# Patient Record
Sex: Male | Born: 1960 | Race: White | Hispanic: Yes | Marital: Married | Smoking: Never smoker
Health system: Southern US, Community
[De-identification: ages and names within clinical notes are randomized; demographics above are authoritative.]

---

## 2003-12-04 ENCOUNTER — Encounter: Admission: RE | Admit: 2003-12-04 | Discharge: 2003-12-04 | Payer: Self-pay | Admitting: Family Medicine

## 2004-11-08 ENCOUNTER — Emergency Department (HOSPITAL_COMMUNITY): Admission: EM | Admit: 2004-11-08 | Discharge: 2004-11-08 | Payer: Self-pay | Admitting: Family Medicine

## 2004-11-08 ENCOUNTER — Ambulatory Visit (HOSPITAL_COMMUNITY): Admission: RE | Admit: 2004-11-08 | Discharge: 2004-11-08 | Payer: Self-pay | Admitting: Family Medicine

## 2004-11-20 ENCOUNTER — Emergency Department (HOSPITAL_COMMUNITY): Admission: EM | Admit: 2004-11-20 | Discharge: 2004-11-20 | Payer: Self-pay | Admitting: Family Medicine

## 2004-11-21 ENCOUNTER — Ambulatory Visit: Payer: Self-pay | Admitting: Sports Medicine

## 2004-11-29 ENCOUNTER — Ambulatory Visit: Payer: Self-pay | Admitting: Family Medicine

## 2005-02-03 ENCOUNTER — Ambulatory Visit: Payer: Self-pay | Admitting: Family Medicine

## 2006-01-29 IMAGING — CT CT HEAD W/O CM
1 of 2 series · 13 of 30 positions shown, 17 images · IV contrast (agent unspecified)
Comparison: None.

CLINICAL DATA: Frontal headache.
 CT HEAD WITHOUT CONTRAST:
TECHNIQUE: Routine unenhanced CT of the brain.

[Series 2: brain · axial · 0.47mm/px · z∈[+193,+321]mm · 13 of 28 slices shown, 17 images]
[im 2/28  brain]
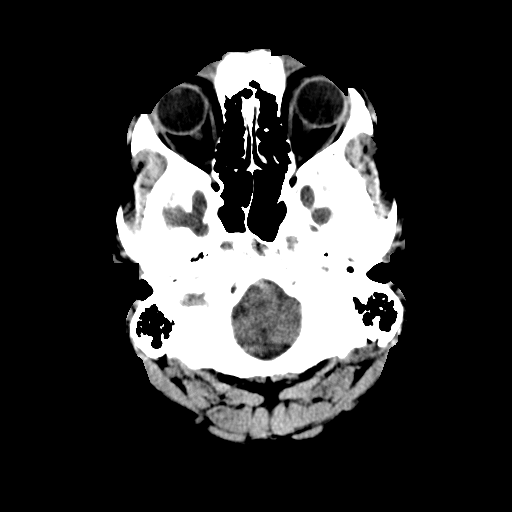
[im 2/28  bone]
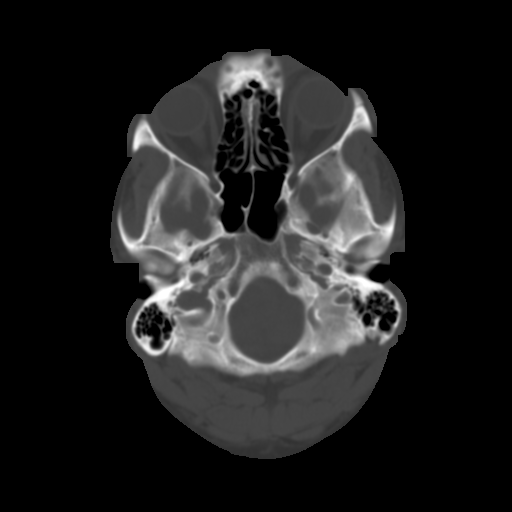
[im 4/28  brain]
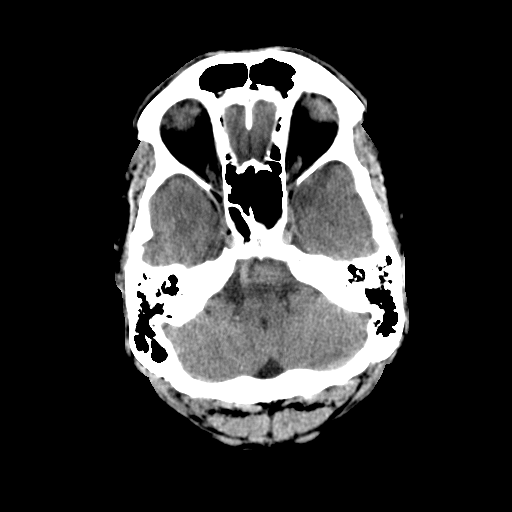
[im 6/28  brain]
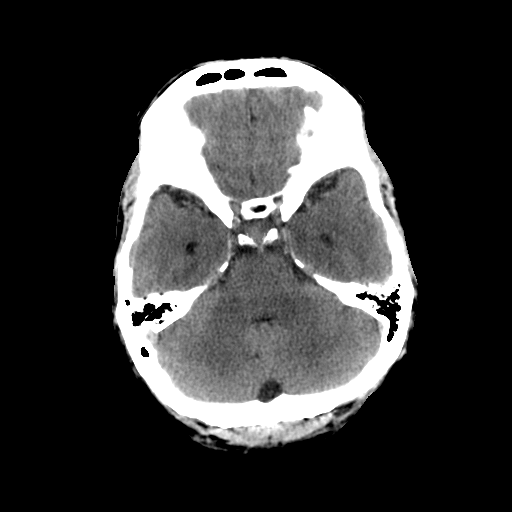
[im 8/28  brain]
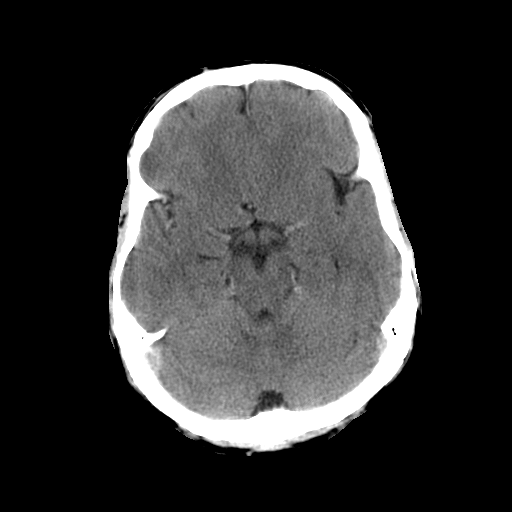
[im 10/28  brain]
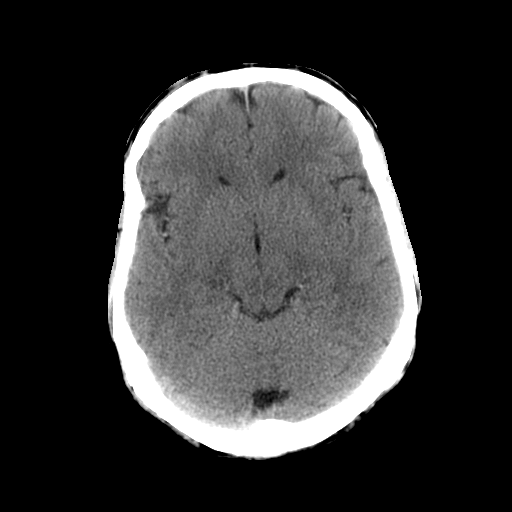
[im 10/28  bone]
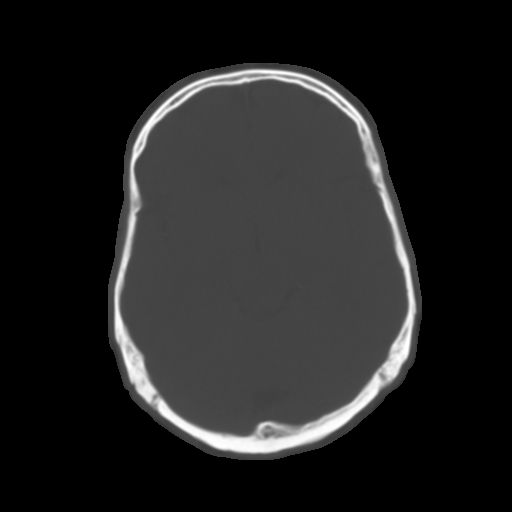
[im 12/28  brain]
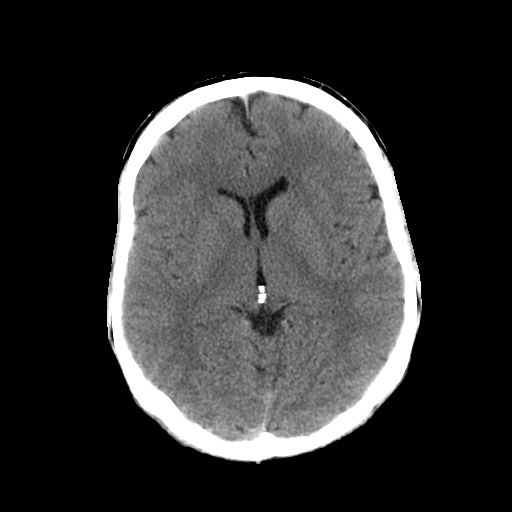
[im 14/28  brain]
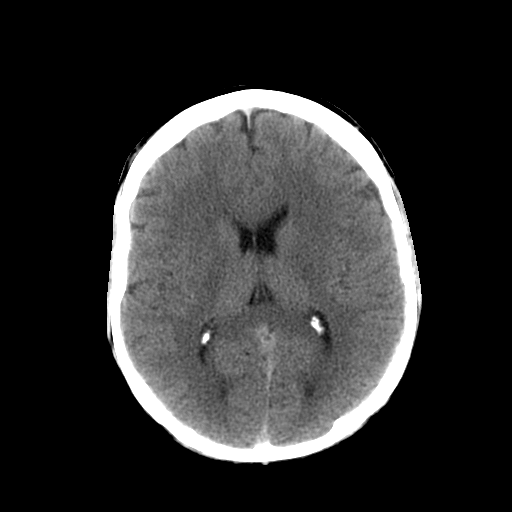
[im 16/28  brain]
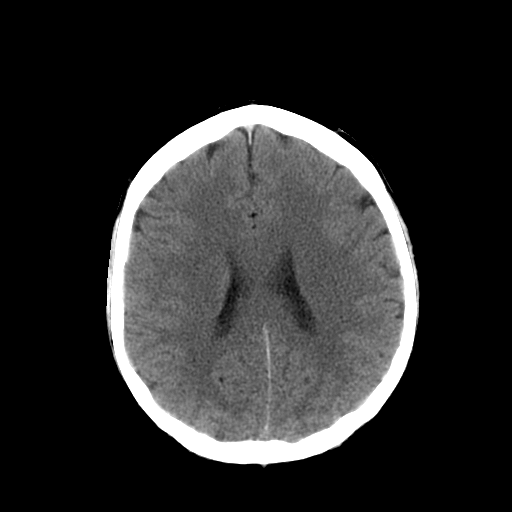
[im 18/28  brain]
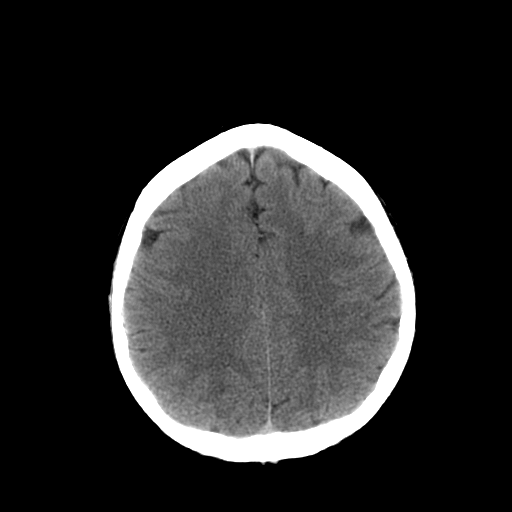
[im 18/28  bone]
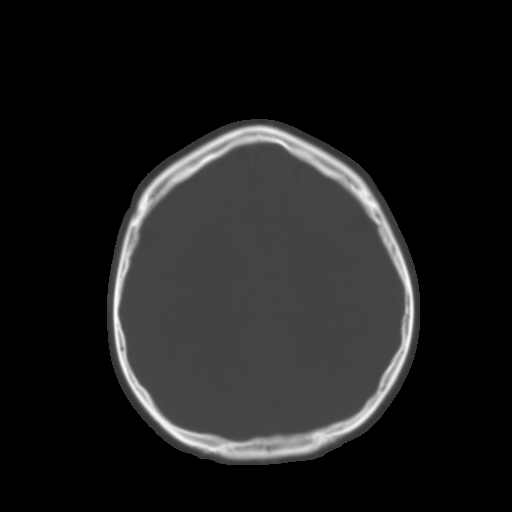
[im 20/28  brain]
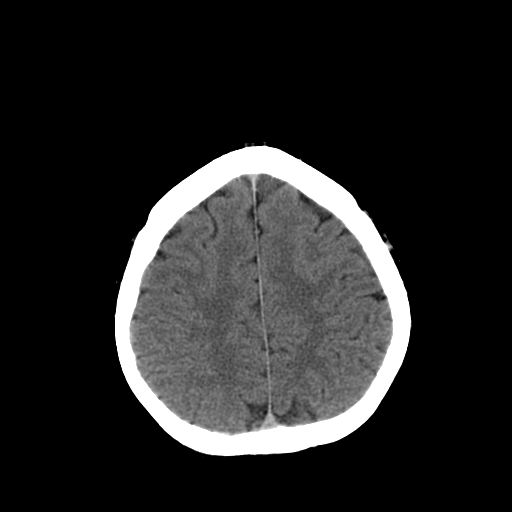
[im 22/28  brain]
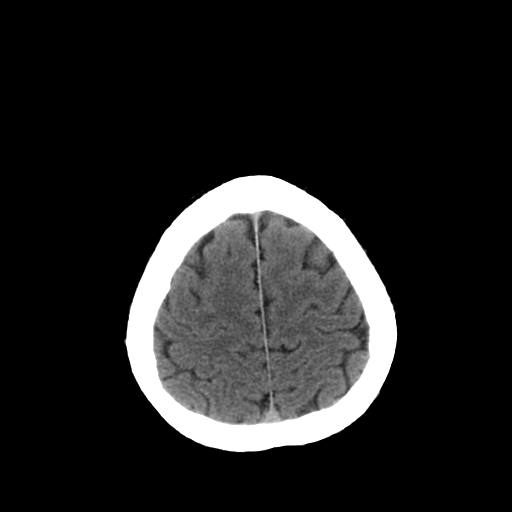
[im 24/28  brain]
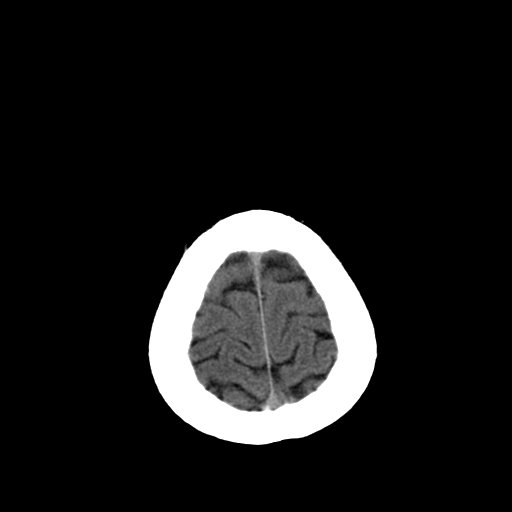
[im 26/28  brain]
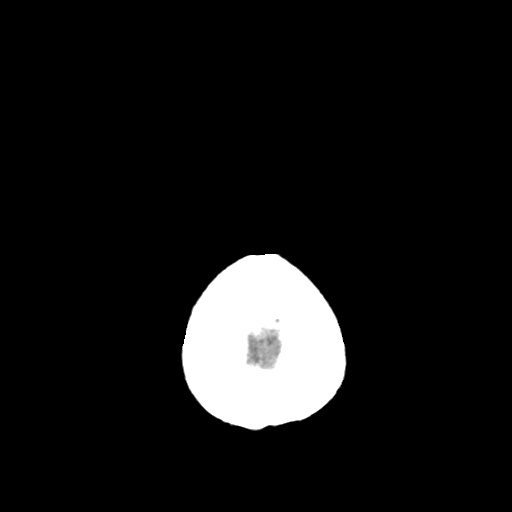
[im 26/28  bone]
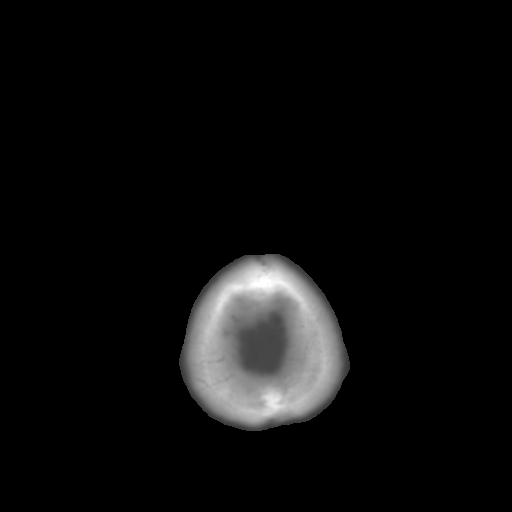

[13 of 30 positions shown; findings below may reference images not displayed]

FINDINGS: There is no evidence of intracranial hemorrhage, brain edema, or mass effect.  The ventricles are normal.  No extra-axial abnormalities are identified.  Bone windows show no significant abnormalities.
IMPRESSION: Negative noncontrast head CT.

## 2006-12-06 ENCOUNTER — Encounter: Payer: Self-pay | Admitting: Family Medicine

## 2006-12-07 ENCOUNTER — Ambulatory Visit: Payer: Self-pay | Admitting: Family Medicine

## 2006-12-07 DIAGNOSIS — E663 Overweight: Secondary | ICD-10-CM | POA: Insufficient documentation

## 2008-09-04 ENCOUNTER — Ambulatory Visit: Payer: Self-pay | Admitting: Family Medicine

## 2008-09-13 DIAGNOSIS — R740 Nonspecific elevation of levels of transaminase and lactic acid dehydrogenase [LDH]: Secondary | ICD-10-CM

## 2008-09-13 LAB — CONVERTED CEMR LAB
ALT: 82 units/L — ABNORMAL HIGH (ref 0–53)
AST: 41 units/L — ABNORMAL HIGH (ref 0–37)
BUN: 19 mg/dL (ref 6–23)
CO2: 22 meq/L (ref 19–32)
Calcium: 9.5 mg/dL (ref 8.4–10.5)
Creatinine, Ser: 1.15 mg/dL (ref 0.40–1.50)
Glucose, Bld: 129 mg/dL — ABNORMAL HIGH (ref 70–99)
Sodium: 142 meq/L (ref 135–145)

## 2008-09-15 ENCOUNTER — Ambulatory Visit: Payer: Self-pay | Admitting: Family Medicine

## 2008-09-15 ENCOUNTER — Encounter: Payer: Self-pay | Admitting: Family Medicine

## 2008-09-15 LAB — CONVERTED CEMR LAB
ALT: 62 units/L — ABNORMAL HIGH (ref 0–53)
HCV Ab: NEGATIVE
Hep B S Ab: NEGATIVE
Indirect Bilirubin: 0.4 mg/dL (ref 0.0–0.9)
Total Protein: 8.1 g/dL (ref 6.0–8.3)

## 2008-09-28 ENCOUNTER — Encounter: Payer: Self-pay | Admitting: Family Medicine

## 2013-11-07 ENCOUNTER — Encounter: Payer: Self-pay | Admitting: Sports Medicine

## 2013-11-21 ENCOUNTER — Encounter: Payer: Self-pay | Admitting: Sports Medicine

## 2013-11-21 ENCOUNTER — Ambulatory Visit (INDEPENDENT_AMBULATORY_CARE_PROVIDER_SITE_OTHER): Payer: 59 | Admitting: Sports Medicine

## 2013-11-21 VITALS — BP 122/80 | HR 66 | Temp 99.0°F | Ht 66.0 in | Wt 196.0 lb

## 2013-11-21 DIAGNOSIS — L989 Disorder of the skin and subcutaneous tissue, unspecified: Secondary | ICD-10-CM

## 2013-11-21 DIAGNOSIS — R7402 Elevation of levels of lactic acid dehydrogenase (LDH): Secondary | ICD-10-CM

## 2013-11-21 DIAGNOSIS — Z299 Encounter for prophylactic measures, unspecified: Secondary | ICD-10-CM

## 2013-11-21 DIAGNOSIS — E663 Overweight: Secondary | ICD-10-CM

## 2013-11-21 DIAGNOSIS — R7401 Elevation of levels of liver transaminase levels: Secondary | ICD-10-CM

## 2013-11-21 DIAGNOSIS — R740 Nonspecific elevation of levels of transaminase and lactic acid dehydrogenase [LDH]: Secondary | ICD-10-CM

## 2013-11-21 NOTE — Progress Notes (Signed)
  Steve SeniorJavier Morrison - 53 y.o. male MRN [id number]  Date of birth: 07/10/1961  SUBJECTIVE:     CC: Annual Exam See problem based charting for additional subjective (including HPI, Interval History & ROS)   He is here today with his wife.  He has no specific concerns but has been recommended to be seen by his insurance company.  12 point review of systems reviewed per [facility] intake sheet denies symptoms within all 12 systems negative  Pt denies chest pain, dyspnea at rest or exertion, PND, lower extremity edema.  Pt denies hypoglycemic symptoms/episodes.  No reported polyuria/polydipsia. Pt is compliant with foot exams and denies any new foot lesions or new sensory changes/dysesthesias.  No change in bowel regimen.  No melena, hematochezia, constipation or loose stools.  Moderate alcohol intake, fresh foods prepared at home for regular meal.  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  Does not use illicit drugs, does not smoke, occasional moderate intake of less than 3 drinks per session.  Denies any issues with dysuria, frequency, hesitancy, hesitation, interrupted stream or nocturia.  In need of screening colonoscopy.  Previous history of elevated transaminitis  HISTORY: Wt Readings from Last 3 Encounters:  11/21/13 196 lb (88.905 kg)  09/04/08 189 lb 11.2 oz (86.047 kg)  12/07/06 185 lb 7 oz (84.114 kg)   BP Readings from Last 3 Encounters:  11/21/13 122/80  09/04/08 126/76  12/07/06 119/73    History  Smoking status  . Never Smoker   Smokeless tobacco  . Not on file   Health Maintenance Due  Topic  . Colonoscopy   . Tetanus/tdap     Otherwise past Medical, Surgical, Social, and Family History Reviewed per EMR Medications and Allergies reviewed and updated per below.  VITALS: BP 122/80  Pulse 66  Temp(Src) 99 F (37.2 C) (Oral)  Ht 5\' 6"  (1.676 m)  Wt 196 lb (88.905 kg)  BMI 31.65  kg/m2  PHYSICAL EXAM: GENERAL:  adult Hispanic male. In no discomfort; no respiratory distress  PSYCH: alert and appropriate, good insight  Cage negative  HNEENT:  no JVD, no thyromegaly   CARDIO: RRR, S1/S2 heard, no murmur  LUNGS: CTA B, no wheezes, no crackles  ABDOMEN:  protuberant, soft, nontender no masses appreciated   EXTREM:  Warm, well perfused.  Moves all 4 extremities spontaneously; no lateralization.   Pedal pulses 2/4.  no pretibial edema.  GU:   SKIN:     MEDICATIONS, LABS & OTHER ORDERS: Previous Medications   No medications on file   Modified Medications   No medications on file   New Prescriptions   No medications on file   Discontinued Medications   No medications on file  No orders of the defined types were placed in this encounter.   ASSESSMENT & PLAN: See problem based charting & AVS for pt instructions.

## 2013-11-21 NOTE — Patient Instructions (Signed)
Please schedule a lab appointment for first thing one morning in the next 1-2 weeks.  Please do not eat anything for 8 hours before your visit (dont eat after midnight).   I will call you with any abnormal labs.  Otherwise, I will send you a letter with your normal results and recommendations for repeat testing   Here are some basic exercise recommendations to remember:  Try to be active every day and throughout the day.      Ideally, I recommend you exercise for at least 30 minutes per day, 5 days per week.  This would involve any activity that will elevate your heart rate to the point that you have a hard time carrying on a normal conversation, but not to the point of being completely out of breath.  There are alternative options however this is a generally good goal to strive for.   We have actually found that being active throughout the day is more important than getting to the gym 5 days per week.  Minimizing being in active should be an important health goal we all are working towards.  A basic starting point can be limiting the time that you sit or lay still during the day.  You should sit/lay/lounge for no longer than 20-30 minutes at a time if you are able.  Even interrupting sitting with standing/jumping jacks/dancing/etc for one minute can have significant health benefits.    Try to remember: "Why sit when you can stand, why stand when you can walk, why walk when you can run."  The point he is to look for opportunities during the day where you can increase your heart rate.    I am always happy to talk more about "Exercise as medicine" if you are interested"    Here are some basic nutrition rules to remember:  "Eat Real Foods & Drink Real Drinks" - if you think it was made in a factory . . it is best to avoid as a staple in your diet.  Limiting processed  foods to 1-2 times per week is a good idea.  Food that came from the ground, from a tree, from a plant or is a plant, is food  that you can essentially eat as much of it he would like as long as it looks like it did when it came from that place!!!  I've never seen a french fry come out of the ground!  Obviously everything should be eaten in moderation but this can be generally applied.   Limit your salt intake (in general aim for less than 3000mg  per day). Sticking with fresh fruits and vegetables as well as home cooked meals will typically provide more nutrition and less salt than prepackaged meals.     Limit the amount of sugar sweetened and artificially sweetened foods and beverages.  Avoid soda, juices and generally any bottled beverage is a good idea.  Sticking with water flavored with a slice of lemon, lime or orange is a great option if you want something with flavor in it.  Using flavored seltzer water to flavor plain water will also add some bite if you want something more than flavor.      Eat at least 3 meals and 1-2 snacks per day.  Aim for no more than 5 hours between eating.  This will actually increase your metabolism and help prevent you from overeating.   Here are 2 of my favorite web sites that provide great nutrition and exercise advice.  www.eatsmartmovemore[place].com Www.choosemyplate.gov

## 2013-11-22 ENCOUNTER — Encounter: Payer: Self-pay | Admitting: Sports Medicine

## 2013-11-22 NOTE — Assessment & Plan Note (Signed)
Encouraged weight loss through diet and exercise. Consider decreasing alcohol consumption if persistent weight gain occurs.

## 2013-11-22 NOTE — Assessment & Plan Note (Addendum)
Refer to gastroenterology for routine screening colonoscopy.  Asymptomatic Discussed utility of PSA screening and patient declines at this time. Orders for a.m. fasting lab work. TSH for weight gain Hep C NEG in 2010; HIV per CDC recommendations >50% of this 25 minute visit spent in direct patient counseling and/or coordination of care regarding healthy behaviors and diet and exercise.

## 2013-11-22 NOTE — Assessment & Plan Note (Signed)
Recheck liver enzymes today.  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

## 2013-11-24 ENCOUNTER — Encounter: Payer: Self-pay | Admitting: *Deleted

## 2013-11-24 ENCOUNTER — Encounter: Payer: Self-pay | Admitting: Internal Medicine

## 2014-01-02 ENCOUNTER — Encounter: Payer: Self-pay | Admitting: *Deleted

## 2014-01-16 ENCOUNTER — Encounter: Payer: 59 | Admitting: Internal Medicine

## 2018-01-20 ENCOUNTER — Encounter
